# Patient Record
Sex: Male | Born: 1985 | Race: White | Hispanic: Yes | Marital: Single | Smoking: Current some day smoker
Health system: Southern US, Community
[De-identification: ages and names within clinical notes are randomized; demographics above are authoritative.]

---

## 2007-05-18 ENCOUNTER — Emergency Department (HOSPITAL_COMMUNITY): Admission: EM | Admit: 2007-05-18 | Discharge: 2007-05-18 | Payer: Self-pay | Admitting: Emergency Medicine

## 2007-05-19 ENCOUNTER — Ambulatory Visit (HOSPITAL_COMMUNITY): Admission: RE | Admit: 2007-05-19 | Discharge: 2007-05-19 | Payer: Self-pay | Admitting: Emergency Medicine

## 2008-07-11 IMAGING — US US ABDOMEN COMPLETE
1 series · 14 of 25 positions shown · non-contrast
Comparison: none

HISTORY: Abdominal pain, question cholelithiasis

ULTRASOUND ABDOMEN COMPLETE:
TECHNIQUE: Complete abdominal ultrasound examination was performed including
evaluation of the liver, gallbladder, bile ducts, pancreas, kidneys, spleen,
IVC, and abdominal aorta.

[Series 1: unknown · 0.32mm/px · 14 of 60 slices shown]
[im 1/60]
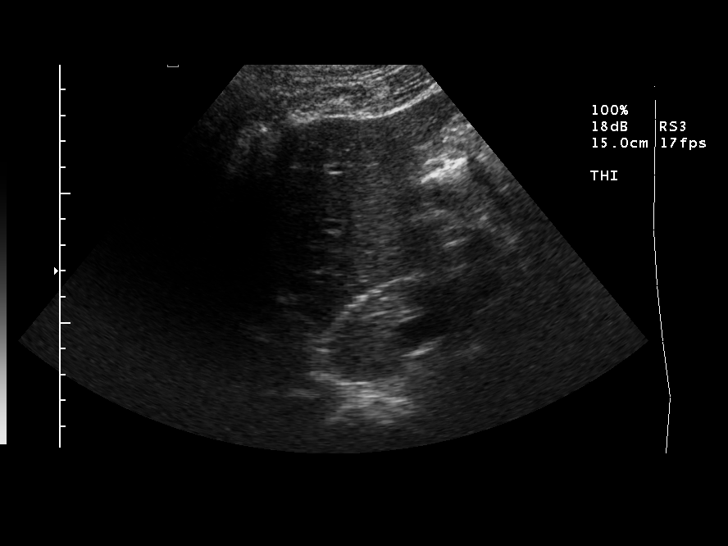
[im 5/60]
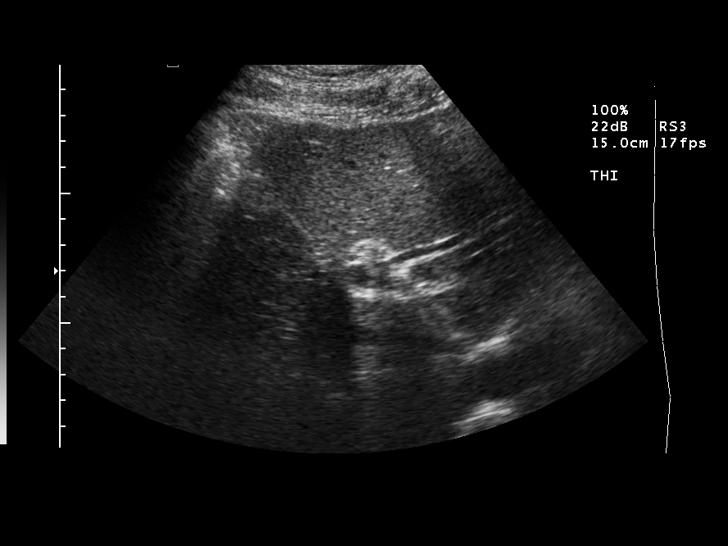
[im 10/60]
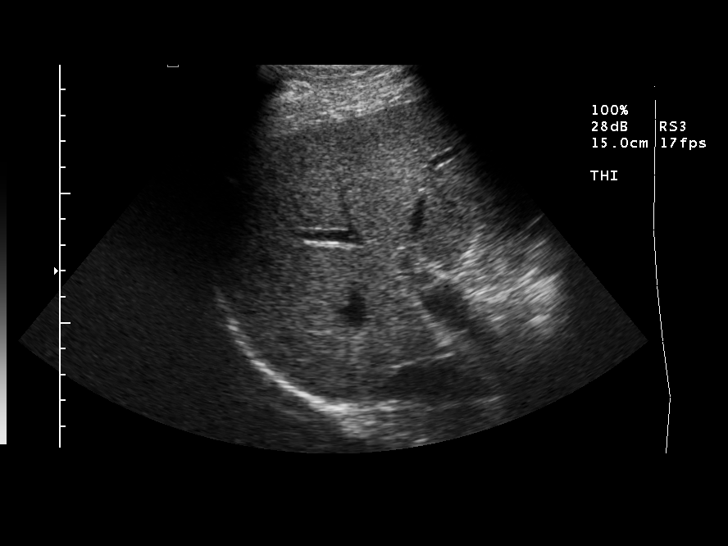
[im 15/60]
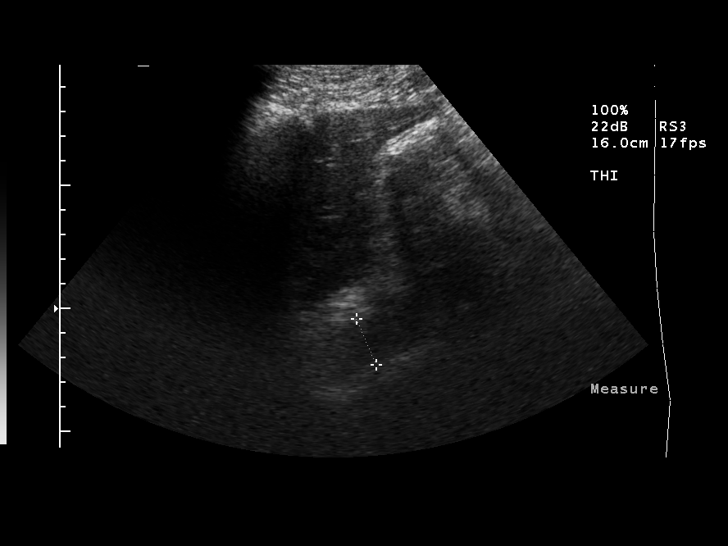
[im 20/60]
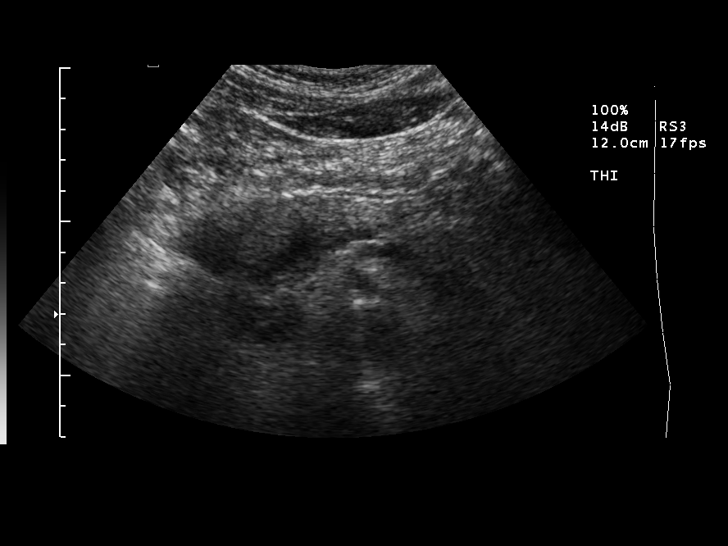
[im 23/60]
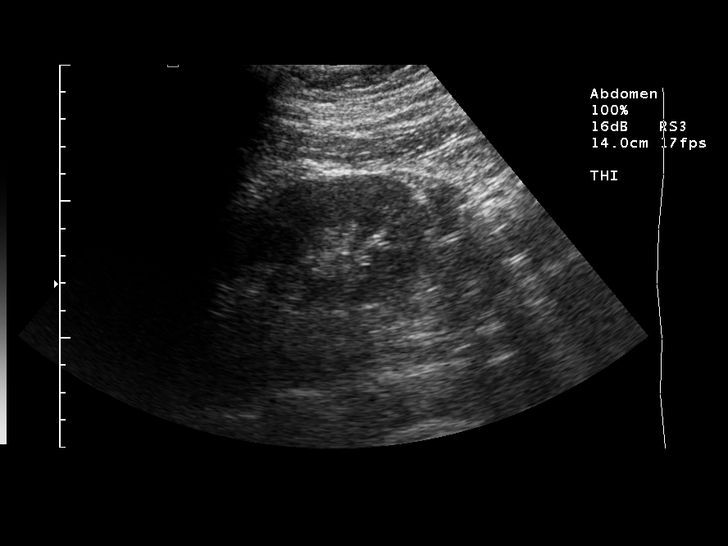
[im 28/60]
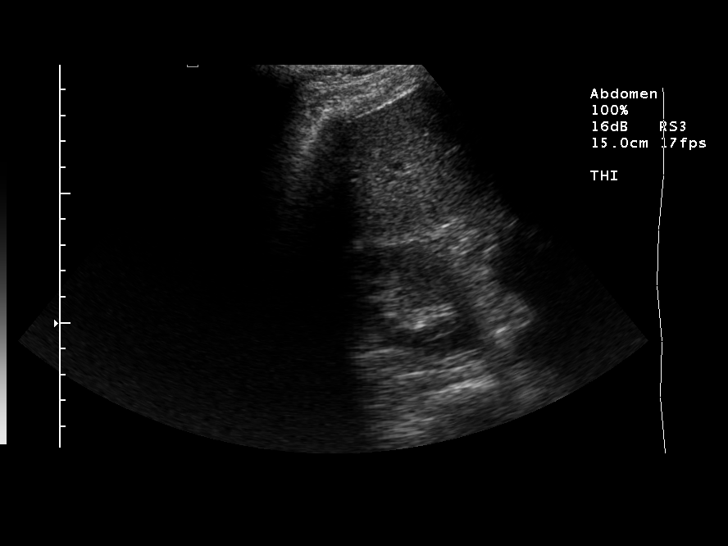
[im 32/60]
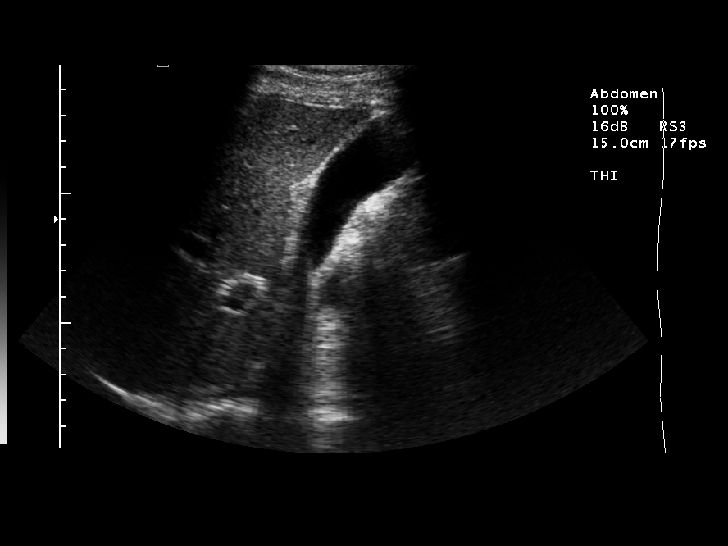
[im 37/60]
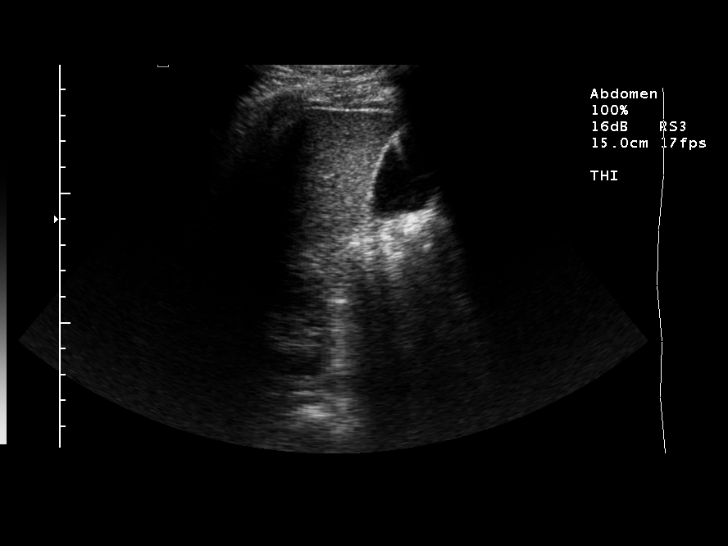
[im 40/60]
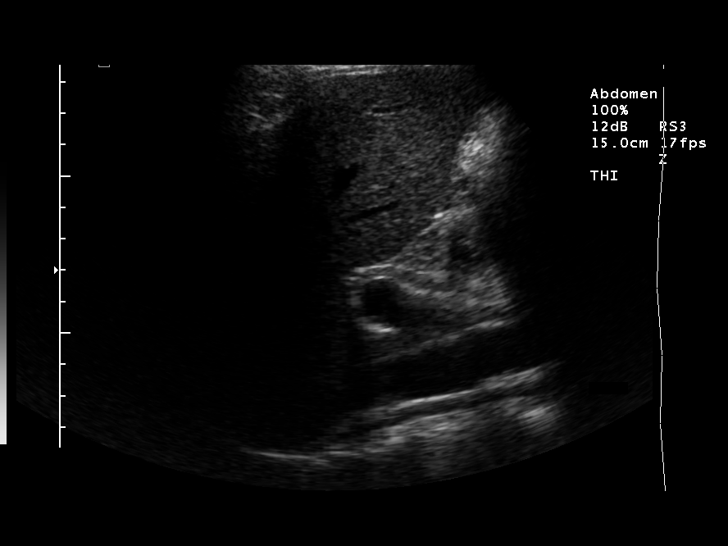
[im 45/60]
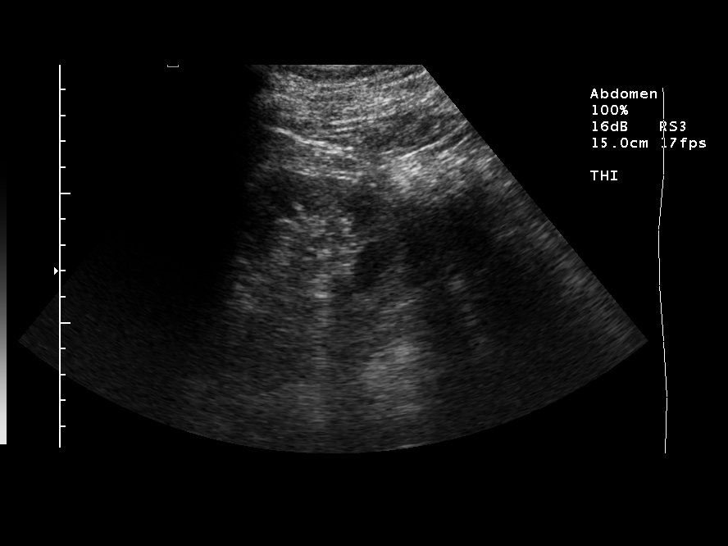
[im 50/60]
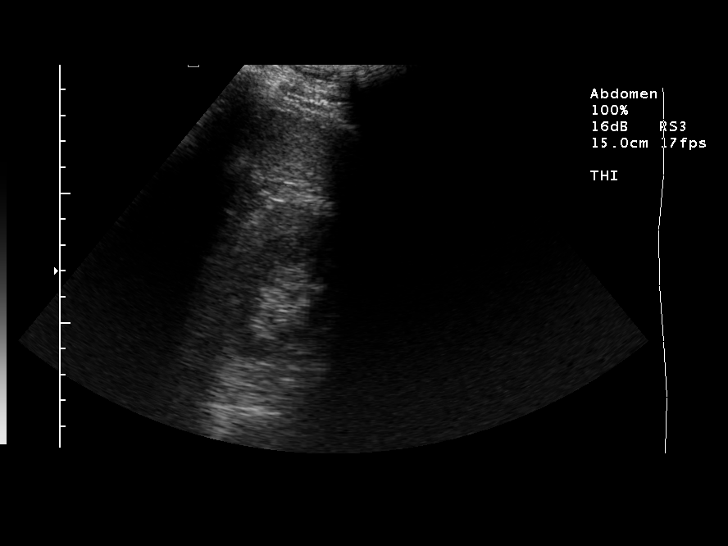
[im 55/60]
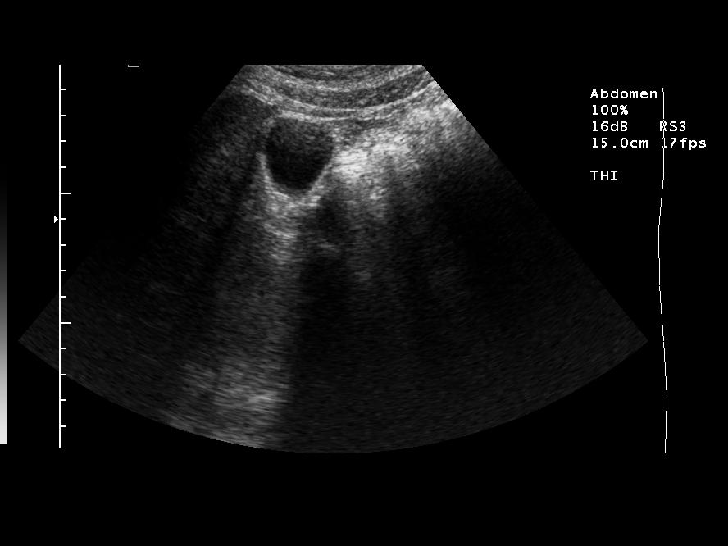
[im 60/60]
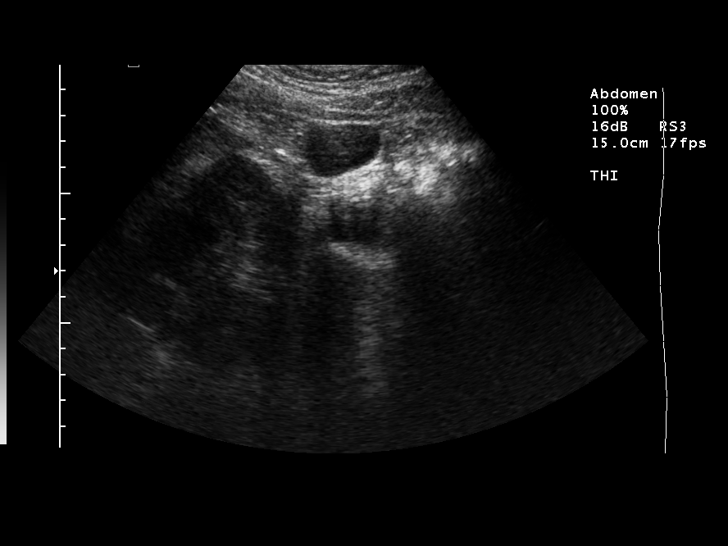

[14 of 25 positions shown; findings below may reference images not displayed]

Gallbladder normally distended without gallstones or gallbladder wall
thickening.
Foci of ring-down artifact are seen from gallbladder wall, question
cholesterolosis.
No pericholecystic fluid or sonographic Murphy's sign.
Common bile duct normal caliber 3 mm. 
Liver, pancreas, and spleen normal appearance, spleen 11.5 cm length.
Kidneys normal size and morphology, 10.7 cm length right and 10.9 cm leg left.
Aorta and IVC normal.
No free fluid.
IMPRESSION: Minimal cholesterolosis of gallbladder wall presumed
Otherwise normal exam.
Specifically, no evidence of cholelithiasis.

## 2011-08-11 LAB — DIFFERENTIAL
Basophils Absolute: 0
Eosinophils Absolute: 0.1
Eosinophils Relative: 2
Lymphocytes Relative: 26
Monocytes Absolute: 0.8 — ABNORMAL HIGH

## 2011-08-11 LAB — COMPREHENSIVE METABOLIC PANEL
ALT: 35
AST: 26
Albumin: 4
Chloride: 103
Creatinine, Ser: 0.9
GFR calc Af Amer: >60
Potassium: 3.6
Sodium: 136
Total Bilirubin: 0.5

## 2011-08-11 LAB — URINALYSIS, ROUTINE W REFLEX MICROSCOPIC
Bilirubin Urine: NEGATIVE
Hgb urine dipstick: NEGATIVE
Specific Gravity, Urine: 1.021
pH: 5.5

## 2011-08-11 LAB — CBC
MCV: 87.2
Platelets: 244
RBC: 4.9
WBC: 7.8

## 2014-12-15 ENCOUNTER — Ambulatory Visit (INDEPENDENT_AMBULATORY_CARE_PROVIDER_SITE_OTHER): Payer: Self-pay | Admitting: Physician Assistant

## 2014-12-15 VITALS — BP 120/80 | HR 74 | Temp 98.7°F | Resp 16 | Ht 68.0 in | Wt 179.0 lb

## 2014-12-15 DIAGNOSIS — Z113 Encounter for screening for infections with a predominantly sexual mode of transmission: Secondary | ICD-10-CM

## 2014-12-15 DIAGNOSIS — B369 Superficial mycosis, unspecified: Secondary | ICD-10-CM

## 2014-12-15 DIAGNOSIS — Z13220 Encounter for screening for lipoid disorders: Secondary | ICD-10-CM

## 2014-12-15 DIAGNOSIS — Z0000 Encounter for general adult medical examination without abnormal findings: Secondary | ICD-10-CM

## 2014-12-15 LAB — LIPID PANEL
Cholesterol: 205 mg/dL — ABNORMAL HIGH (ref 0–200)
HDL: 63 mg/dL (ref >39)
LDL CALC: 118 mg/dL — AB (ref 0–99)
Total CHOL/HDL Ratio: 3.3 Ratio
Triglycerides: 121 mg/dL (ref <150)
VLDL: 24 mg/dL (ref 0–40)

## 2014-12-15 MED ORDER — CLOTRIMAZOLE-BETAMETHASONE 1-0.05 % EX CREA: 1.0000 "application " | TOPICAL_CREAM | Freq: Two times a day (BID) | CUTANEOUS | Status: AC

## 2014-12-15 NOTE — Progress Notes (Signed)
[facility] [address], [place] [id number] [phone]  Date:  12/15/2014   Name:  Adam Acosta   DOB:  05/17/1986   MRN:  [id number]  PCP:  No PCP Per Patient    Chief Complaint: Annual Exam   History of Present Illness:  Adam Acosta is a 29 y.o. very pleasant male patient who [facility].  Patient has no concerns or complaints at this time.  Patient states diet consist of little vegetables and includes a lot of fried foods.  Hydration 1-2 bottles per week.  Drinks a lot of soda and beer.    Normal bowel movements 2-3x per day without blood in stool.  Urination is normal without dysuria, hematuria, or polyuria  Patient has rash that has been present for a little less than 1 month.  He noticed "red bumps" along the posterior shoulder, but has progressed down the back.  It does not itch.  He has no pets.  Roommate does not complain of related symptoms.  He has done nothing to relieve it.  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    EtOH: 12 drinks per week, 1-2 drinks per day Tobacco: 1 cigarette/week.   Sexually active: Condoms-sometimes.  1.5 years ago.    He works with Development worker, [employer].  Lives with a friend.  Divorced   There are no active problems to display for this patient.   History reviewed. No pertinent past medical history.  History reviewed. No pertinent past surgical history.  History  Substance Use Topics  . Smoking status: Current Some Day Smoker  . Smokeless tobacco: Not on file  . Alcohol Use: Not on file    Family History  Problem Relation Age of Onset  . Diabetes Mother     No Known Allergies  Medication list has been reviewed and updated.  No current outpatient prescriptions on file prior to visit.   No current facility-administered medications on file prior to visit.    Review of Systems: ROS otherwise unremarkable unless listed above.   Physical  Examination: Filed Vitals:   12/15/14 1553  BP: 120/80  Pulse: 74  Temp: 98.7 F (37.1 C)  Resp: 16   Filed Vitals:   12/15/14 1553  Height: 5\' 8"  (1.727 m)  Weight: 179 lb (81.194 kg)   Body mass index is 27.22 kg/(m^2). Ideal Body Weight: Weight in (lb) to have BMI = 25: 164.1  Physical Exam  Constitutional: He is oriented to person, place, and time. He appears well-developed and well-nourished.  HENT:  Head: Normocephalic and atraumatic.  Right Ear: External ear normal.  Left Ear: External ear normal.  Nose: Nose normal.  Mouth/Throat: Oropharynx is clear and moist.  Eyes: Conjunctivae and EOM are normal. Pupils are equal, round, and reactive to light. Right eye exhibits no discharge. Left eye exhibits no discharge.  Neck: Normal range of motion. No tracheal deviation present. No thyromegaly present.  Cardiovascular: Normal rate, regular rhythm, normal heart sounds and intact distal pulses.   Pulmonary/Chest: Effort normal and breath sounds normal. No respiratory distress. He has no wheezes.  Abdominal: Soft. Bowel sounds are normal. He exhibits no distension and no mass. There is no tenderness.  Musculoskeletal: Normal range of motion. He exhibits no edema or tenderness.  Neurological: He is alert and oriented to person, place, and time. He has normal reflexes. He displays normal reflexes. No cranial nerve deficit. Coordination normal.  Skin: Skin is warm  and dry. No rash noted. No erythema.  Erythematous macular papules diffused along the back to lower thoracic, with some annular tracing.    Psychiatric: He has a normal mood and affect. His behavior is normal.     Assessment and Plan: 29 year old male is here today for annual physical exam. Per recommendations, lipid and std screening placed.   Treating rash with anti-fungal BID.  Will return if no improvement in 7 days.  If improving, will continue for an additional 1 week.    Screening for cholesterol level - Plan:  Lipid panel  Screening for STD (sexually transmitted disease) - Plan: HIV antibody, RPR  Fungal infection of skin - Plan: clotrimazole-betamethasone (LOTRISONE) cream  Trena Platt, PA-C [facility] 2/19/201610:34 PM

## 2014-12-15 NOTE — Patient Instructions (Signed)
64oz of water per day. Watch your fried foods and eat more fresh vegetables and fruits.      Use the cream twice per day for up to 2 weeks.  If the symptoms do not improve, return to the clinic.  Keeping you healthy  Get these tests  Blood pressure- Have your blood pressure checked once a year by your healthcare provider.  Normal blood pressure is 120/80.  Weight- Have your body mass index (BMI) calculated to screen for obesity.  BMI is a measure of body fat based on height and weight. You can also calculate your own BMI at [email or web address]-[email or web address]/.  Cholesterol- Have your cholesterol checked regularly starting at age 31, sooner may be necessary if you have diabetes, high blood pressure, if a family member developed heart diseases at an early age or if you smoke.   Chlamydia, HIV, and other sexual transmitted disease- Get screened each year until the age of 31 then within three months of each new sexual partner.  Diabetes- Have your blood sugar checked regularly if you have high blood pressure, high cholesterol, a family history of diabetes or if you are overweight.  Get these vaccines  Flu shot- Every fall.  Tetanus shot- Every 10 years.  Menactra- Single dose; prevents meningitis.  Take these steps  Don't smoke- If you do smoke, ask your healthcare provider about quitting. For tips on how to quit, go to www.smokefree.gov or call 1-800-QUIT-NOW.  Be physically active- Exercise 5 days a week for at least 30 minutes.  If you are not already physically active start slow and gradually work up to 30 minutes of moderate physical activity.  Examples of moderate activity include walking briskly, mowing the yard, dancing, swimming bicycling, etc.  Eat a healthy diet- Eat a variety of healthy foods such as fruits, vegetables, low fat milk, low fat cheese, yogurt, lean meats, poultry, fish, beans, tofu, etc.  For more information on healthy eating, go to www.thenutritionsource.org  Drink  alcohol in moderation- Limit alcohol intake two drinks or less a day.  Never drink and drive.  Dentist- Brush and floss teeth twice daily; visit your dentis twice a year.  Depression-Your emotional health is as important as your physical health.  If you're feeling down, losing interest in things you normally enjoy please talk with your healthcare provider.  Gun Safety- If you keep a gun in your home, keep it unloaded and with the safety lock on.  Bullets should be stored separately.  Helmet use- Always wear a helmet when riding a motorcycle, bicycle, rollerblading or skateboarding.  Safe sex- If you may be exposed to a sexually transmitted infection, use a condom  Seat belts- Seat bels can save your life; always wear one.  Smoke/Carbon Monoxide detectors- These detectors need to be installed on the appropriate level of your home.  Replace batteries at least once a year.  Skin Cancer- When out in the sun, cover up and use sunscreen SPF 15 or higher.  Violence- If anyone is threatening or hurting you, please tell your healthcare provider.

## 2014-12-16 LAB — RPR

## 2014-12-16 LAB — HIV ANTIBODY (ROUTINE TESTING W REFLEX): HIV: NONREACTIVE

## 2015-01-22 ENCOUNTER — Ambulatory Visit (INDEPENDENT_AMBULATORY_CARE_PROVIDER_SITE_OTHER): Payer: Self-pay | Admitting: Internal Medicine

## 2015-01-22 VITALS — BP 138/94 | HR 66 | Temp 98.1°F | Resp 16 | Ht 68.0 in | Wt 180.0 lb

## 2015-01-22 DIAGNOSIS — B360 Pityriasis versicolor: Secondary | ICD-10-CM

## 2015-01-22 MED ORDER — TERBINAFINE HCL 250 MG PO TABS: 250.0000 mg | ORAL_TABLET | ORAL | Status: AC

## 2015-01-23 NOTE — Progress Notes (Signed)
Note last visit His rash is unchanged He has a diffuse eruption on his back that does not each or hurt which has been stable for a few months now Unaware of any past skin problems or a new contacts He has not been ill in any way He is mainly worried about whether he is contagious for his wife and children  Works sheet rock  Exam BP 138/94 mmHg  Pulse 66  Temp(Src) 98.1 F (36.7 C) (Oral)  Resp 16  Ht 5\' 8"  (1.727 m)  Wt 180 lb (81.647 kg)  BMI 27.38 kg/m2  SpO2 98% He has a diffuse small maculopapular eruption on his back that is flesh-colored and in many spots very small without involvement of hair follicles. There are no lesions with central clearing or raised borders. There are no pustules or recent colds. There is a very fine scale to the central part of these lesions   Impression Tinea versicolor is most likely  Meds ordered this encounter  Medications  . terbinafine (LAMISIL) 250 MG tablet    Sig: Take 1 tablet (250 mg total) by mouth once a week. For 10 weeks    Dispense:  10 tablet    Refill:  0   dermatology referral if not well in 10 weeks
